# Patient Record
Sex: Male | Born: 1970 | Race: White | Hispanic: No | Marital: Married | Smoking: Current every day smoker
Health system: Southern US, Community
[De-identification: ages and names within clinical notes are randomized; demographics above are authoritative.]

## PROBLEM LIST (undated history)

## (undated) DIAGNOSIS — R011 Cardiac murmur, unspecified: Secondary | ICD-10-CM

---

## 1998-10-26 ENCOUNTER — Emergency Department (HOSPITAL_COMMUNITY): Admission: EM | Admit: 1998-10-26 | Discharge: 1998-10-26 | Payer: Self-pay | Admitting: Emergency Medicine

## 1998-10-26 ENCOUNTER — Encounter: Payer: Self-pay | Admitting: Emergency Medicine

## 1999-03-14 ENCOUNTER — Emergency Department (HOSPITAL_COMMUNITY): Admission: EM | Admit: 1999-03-14 | Discharge: 1999-03-14 | Payer: Self-pay | Admitting: Emergency Medicine

## 1999-03-15 ENCOUNTER — Encounter: Payer: Self-pay | Admitting: Emergency Medicine

## 1999-03-15 ENCOUNTER — Emergency Department (HOSPITAL_COMMUNITY): Admission: EM | Admit: 1999-03-15 | Discharge: 1999-03-15 | Payer: Self-pay | Admitting: *Deleted

## 2002-08-27 ENCOUNTER — Emergency Department (HOSPITAL_COMMUNITY): Admission: EM | Admit: 2002-08-27 | Discharge: 2002-08-27 | Payer: Self-pay | Admitting: Emergency Medicine

## 2004-01-28 ENCOUNTER — Emergency Department (HOSPITAL_COMMUNITY): Admission: EM | Admit: 2004-01-28 | Discharge: 2004-01-28 | Payer: Self-pay | Admitting: Emergency Medicine

## 2004-04-23 ENCOUNTER — Emergency Department (HOSPITAL_COMMUNITY): Admission: EM | Admit: 2004-04-23 | Discharge: 2004-04-23 | Payer: Self-pay | Admitting: Family Medicine

## 2004-07-26 ENCOUNTER — Emergency Department (HOSPITAL_COMMUNITY): Admission: EM | Admit: 2004-07-26 | Discharge: 2004-07-26 | Payer: Self-pay | Admitting: Family Medicine

## 2006-04-01 ENCOUNTER — Emergency Department (HOSPITAL_COMMUNITY): Admission: EM | Admit: 2006-04-01 | Discharge: 2006-04-01 | Payer: Self-pay | Admitting: Emergency Medicine

## 2006-04-02 ENCOUNTER — Ambulatory Visit (HOSPITAL_COMMUNITY): Admission: RE | Admit: 2006-04-02 | Discharge: 2006-04-02 | Payer: Self-pay | Admitting: *Deleted

## 2006-04-11 ENCOUNTER — Ambulatory Visit: Payer: Self-pay | Admitting: Gastroenterology

## 2006-04-17 ENCOUNTER — Ambulatory Visit: Payer: Self-pay | Admitting: Gastroenterology

## 2006-04-17 ENCOUNTER — Encounter (INDEPENDENT_AMBULATORY_CARE_PROVIDER_SITE_OTHER): Payer: Self-pay | Admitting: Specialist

## 2006-04-30 ENCOUNTER — Ambulatory Visit: Payer: Self-pay | Admitting: Cardiology

## 2006-04-30 ENCOUNTER — Ambulatory Visit: Payer: Self-pay | Admitting: Gastroenterology

## 2006-04-30 LAB — CONVERTED CEMR LAB
ALT: 19 units/L (ref 0–40)
AST: 19 units/L (ref 0–37)
Albumin: 4 g/dL (ref 3.5–5.2)
Alkaline Phosphatase: 64 units/L (ref 39–117)
BUN: 13 mg/dL (ref 6–23)
Basophils Absolute: 0.1 10*3/uL (ref 0.0–0.1)
Basophils Relative: 0.8 % (ref 0.0–1.0)
Bilirubin, Direct: 0.1 mg/dL (ref 0.0–0.3)
CO2: 27 meq/L (ref 19–32)
Calcium: 9.5 mg/dL (ref 8.4–10.5)
Chloride: 107 meq/L (ref 96–112)
Creatinine, Ser: 1 mg/dL (ref 0.4–1.5)
Eosinophils Absolute: 0.1 10*3/uL (ref 0.0–0.6)
Eosinophils Relative: 1.2 % (ref 0.0–5.0)
Fecal Occult Blood: NEGATIVE
GFR calc Af Amer: 111 mL/min
GFR calc non Af Amer: 91 mL/min
Glucose, Bld: 92 mg/dL (ref 70–99)
HCT: 46 % (ref 39.0–52.0)
Hemoglobin: 15.9 g/dL (ref 13.0–17.0)
Lymphocytes Relative: 28.8 % (ref 12.0–46.0)
MCHC: 34.5 g/dL (ref 30.0–36.0)
MCV: 93.7 fL (ref 78.0–100.0)
Monocytes Absolute: 0.7 10*3/uL (ref 0.2–0.7)
Monocytes Relative: 8.8 % (ref 3.0–11.0)
Neutro Abs: 4.7 10*3/uL (ref 1.4–7.7)
Neutrophils Relative %: 60.4 % (ref 43.0–77.0)
OCCULT 1: NEGATIVE
OCCULT 2: NEGATIVE
OCCULT 3: NEGATIVE
OCCULT 4: NEGATIVE
OCCULT 5: NEGATIVE
Platelets: 167 10*3/uL (ref 150–400)
Potassium: 4.2 meq/L (ref 3.5–5.1)
RBC: 4.91 M/uL (ref 4.22–5.81)
RDW: 11.9 % (ref 11.5–14.6)
Sodium: 141 meq/L (ref 135–145)
TSH: 0.46 microintl units/mL (ref 0.35–5.50)
Total Bilirubin: 0.8 mg/dL (ref 0.3–1.2)
Total Protein: 6.7 g/dL (ref 6.0–8.3)
WBC: 7.8 10*3/uL (ref 4.5–10.5)

## 2006-05-01 ENCOUNTER — Ambulatory Visit: Payer: Self-pay | Admitting: Internal Medicine

## 2006-05-01 LAB — CONVERTED CEMR LAB
Cholesterol: 196 mg/dL (ref 0–200)
Direct LDL: 137.1 mg/dL
HDL: 31.6 mg/dL — ABNORMAL LOW (ref >39.0)
Total CHOL/HDL Ratio: 6.2
Triglycerides: 214 mg/dL (ref 0–149)
VLDL: 43 mg/dL — ABNORMAL HIGH (ref 0–40)

## 2006-05-15 ENCOUNTER — Ambulatory Visit: Payer: Self-pay | Admitting: Cardiology

## 2006-05-27 ENCOUNTER — Ambulatory Visit (HOSPITAL_BASED_OUTPATIENT_CLINIC_OR_DEPARTMENT_OTHER): Admission: RE | Admit: 2006-05-27 | Discharge: 2006-05-27 | Payer: Self-pay | Admitting: Cardiology

## 2006-05-30 ENCOUNTER — Encounter: Payer: Self-pay | Admitting: Cardiology

## 2006-05-30 ENCOUNTER — Ambulatory Visit: Payer: Self-pay

## 2006-05-31 ENCOUNTER — Ambulatory Visit: Payer: Self-pay | Admitting: Pulmonary Disease

## 2006-06-06 ENCOUNTER — Ambulatory Visit: Payer: Self-pay | Admitting: Cardiology

## 2006-06-14 ENCOUNTER — Ambulatory Visit: Payer: Self-pay

## 2010-07-14 NOTE — Assessment & Plan Note (Signed)
[facility]                         GASTROENTEROLOGY OFFICE NOTE   NAME:[name], [name]                     MRN:          [id number]  DATE:04/11/2006                            DOB:          21-Apr-1970    CHIEF COMPLAINT:  The patient is a 40 year old white male, self-referred  for evaluation of GERD, abdominal pain and alternating diarrhea and  constipation.   HISTORY OF PRESENT ILLNESS:  This patient presented to the emergency  room on February 4 for evaluation of abdominal pain.  He has had  multiple episodes of abdominal pain in the epigastric region and in the  right upper quadrant.  He also has lower abdominal discomfort,  associated with bloating and alternating diarrhea and constipation.  He  relates problems with GERD.  He was initially treated with Prevacid,  which was very effective for his symptoms, but was changed to Protonix,  due to improved insurance coverage.  He has lost about 15 pounds over  the past month and has had a substantial decrease in appetite.  CBC,  CMET, lipase and urinalysis from April 01, 2006, were all  unremarkable.  Abdominal ultrasound from April 02, 2006, was normal.   PAST MEDICAL HISTORY:  As in the History of Present Illness.   PAST SURGICAL HISTORY:  Negative.   CURRENT MEDICATIONS:  1. Carafate p.r.n.  2. Vicodin p.r.n.  3. Prevacid daily.   SOCIAL HISTORY:  He is separated and smokes about a pack a day of  cigarettes.  He drinks alcohol on a social basis and denies heavier  usage.  He has one child.   REVIEW OF SYSTEMS:  Remarkable for excessive thirst, back pain, new  headaches and severe fatigue.  The remainder of the review of systems is  negative.   PHYSICAL EXAM:  Mildly anxious.  Height 5 feet 11 inches, weight 239  pounds.  Blood pressure is 110/68, pulse 80 and regular.  HEENT EXAM:  Anicteric sclerae.  Oropharynx clear.  CHEST:  Clear to auscultation bilaterally.  CARDIAC:   Regular rate and rhythm without murmurs appreciated.  ABDOMEN:  Soft with mild epigastric tenderness to deep palpation, no  rebound or guarding.  No palpable organomegaly, masses or hernias.  Normoactive bowel sounds.  RECTAL:  Deferred to time of colonoscopy.  EXTREMITIES:  Without clubbing, cyanosis or edema.  NEUROLOGIC:  Alert and oriented times three.  Appears grossly nonfocal.   ASSESSMENT AND PLAN:  Epigastric pain, right upper quadrant pain, reflux  symptoms, alternating diarrhea and constipation and an unexplained 15-  pound weight-loss.  Obtain stool Hemoccults.  Rule out GERD, ulcer  disease, inflammatory bowel disease and, less likely, colorectal  neoplasms.  Begin Robinul Forte 1 b.i.d.  Begin standard antireflux  measures.  Begin Protonix 40 mg p.o. q.a.m.  Risks, benefits, and  alternatives to colonoscopy and possible biopsy and possible polypectomy  and upper endoscopy and possible biopsy discussed with the patient.  He  consents to proceed.  This will be scheduled electively.     Venita Lick. Russella Dar, MD, [facility]  Electronically Signed    MTS/MedQ  DD: 04/12/2006  DT: 04/12/2006  Job #: [id number]

## 2010-07-14 NOTE — Assessment & Plan Note (Signed)
[facility]                            CARDIOLOGY OFFICE NOTE   NAME:[name]                     MRN:          [id number]  DATE:04/30/2006                            DOB:          Mar 30, 1970    PRIMARY CARE PHYSICIAN:  Dr. Jonny Ruiz.   REASON FOR PRESENTATION:  Evaluate patient with premature ventricular  contractions.   HISTORY OF PRESENT ILLNESS:  The patient is a pleasant 40 year old  gentleman without prior cardiac history.  He was recently being sedated  for upper and lower endoscopy when he was noted to have ventricular  bigeminy.  He does not report having noticed this in particular at that  time.  He denies any presyncope or syncope.  However, he does report  that he gets episodes of his heart beating hard and fast.  This happens  3-4 times per day.  It can happen at rest.  It also can happen with  heavy lifting.  He does get a little bit dizzy with this.  He may get  mildly short of breath.  He denies any chest pressure, neck discomfort,  arm discomfort, activity induced nausea, vomiting, excessive  diaphoresis.  He has frequent headaches.  He does report that he snores  quite a bit.  He is not sure if he stops breathing.  He is active at  work but does not exercise routinely.   PAST MEDICAL HISTORY:  1. Irritable bowel syndrome.  2. Colonic polyps.   ALLERGIES:  NONE.   MEDICATIONS:  1. Protonix 40 mg daily.  2. Prevacid p.r.n.   SOCIAL HISTORY:  The patient is a pipe fitter.  He is single.  He has  two 73 year old daughters.  He has been smoking at least a half pack per  day for 25 years.  Still drinks beer rarely.  He does not use any other  drugs.   FAMILY HISTORY:  Contributory for his mother having bypass in her 16s.  He has a brother in his 30s with heart disease, but he is not sure what  type.   REVIEW OF SYSTEMS:  As stated in the HPI and negative for other systems.   PHYSICAL EXAMINATION:  The patient is in no  distress.  Blood pressure  137/79, heart rate 65 and regular, weight 237 pounds, body mass index  32.  HEENT:  Eyelids unremarkable, pupils equally round, and reactive to  light, fundi not visualized, oral mucosa unremarkable.  NECK:  No jugular venous distention, wave form within normal limits,  carotid upstroke brisk and symmetrical, no bruits, no thyromegaly.  LYMPHATICS:  No cervical, axillary, inguinal adenopathy.  LUNGS:  Clear to auscultation bilaterally.  BACK:  No costovertebral angle tenderness.  CHEST:  Unremarkable.  HEART:  PMI not displaced or sustained, S1 and S2 within normal limits,  no S3, no S4, no clicks, no rubs, no murmurs.  ABDOMEN:  Obese, positive bowel sounds normal in frequency and pitch, no  bruits, no rebound, no guarding, no midline pulsatile mass, no  hepatomegaly, splenomegaly.  SKIN:  No rashes, no nodules.  EXTREMITIES:  With 2+ pulses  throughout, no edema, no cyanosis, no  clubbing.  NEURO:  Oriented to person, place and time, cranial nerves II-XII  grossly intact, motor grossly intact.   EKG sinus rhythm, rate 65, axis within normal limits, intervals within  normal limits, no acute ST-T wave changes.   ASSESSMENT/PLAN:  1. Palpitations.  This patient has palpitations with documented      premature ventricular contractions.  I am going to put a 24 hour      Holter Monitor on because he also describes a tachy arrhythmia      rather than an irregular beat.  We will see if this correlates with      any dysrhythmia on his monitor.  We are also going to check      electrolytes and a TSH.  Further evaluation will be based on these      results.  2. Tobacco.  We had a long discussion about the need to stop smoking.      He is not yet committed to quitting but thinks he can eventually.  3. Followup.  I would like to see him back in about one month or      sooner.     Rollene Rotunda, MD, [facility]  Electronically Signed    JH/MedQ  DD: 04/30/2006  DT:  04/30/2006  Job #: [id number]

## 2010-07-14 NOTE — Procedures (Signed)
[name], [name]              ACCOUNT NO.:  [id number]   MEDICAL RECORD NO.:  [id number]          PATIENT TYPE:  OUT   LOCATION:  SLEEP CENTER                 FACILITY:  [facility]   PHYSICIAN:  Barbaraann Share, MD,FCCPDATE OF BIRTH:  02/23/73   DATE OF STUDY:  05/30/2006                            NOCTURNAL POLYSOMNOGRAM   REFERRING PHYSICIAN:   INDICATIONS FOR PROCEDURE:  Hypersomnia with sleep apnea.   RESULTS:  Epward score is 16.   SLEEP ARCHITECTURE:  The patient had total sleep time of 353 minutes  with decreased REM and never achieved slow wave sleep. Sleep onset  latency was normal, as was REM onset. Sleep efficiency was 90%.   RESPIRATORY DATA:  The patient underwent split night protocol where he  was found to have 31 obstructive events in the first 163 minutes of  sleep. This gave him a extrapolated apnea hypopnea index of 11 events  per hour during the first half of the night. The events occurred  primarily in the supine position and there was mild to moderate snoring  noted. By protocol, the patient was then placed on a medium ResMed  Quattro mask and ultimately titrated to a final CPAP pressure of 10 cm  of water, in order to eliminate both obstructive events and snoring.   OXYGEN DATA:  There was O2 desaturation as low as 85% with the patient's  obstructive events.   CARDIAC DATA:  Frequent PVC's were noted throughout as well as bigeminy.   MOVEMENT/PARASOMNIA:  The patient was found to have 79 leg jerks during  the night with 3.7 per hour resulting in arousal or awakening. However,  these greatly decreased as the CPAP pressure was optimized.   IMPRESSION/RECOMMENDATION:  1. Split night study reveals mild obstructive sleep apnea/hypopnea      syndrome with an apnea hypopnea of 11 events per hour during the      first half of the night and O2 desaturation as low as 85%. The      patient was then placed on a medium ResMed Quattro full face mask      and  ultimately titrated to a final pressure of 10 cm of water, in      order to control both obstructive events and snoring. Because of      the very mild nature of the patient's obstructive apnea, reasonable      treatment could also include weight loss alone if applicable, upper      airway surgery, or oral appliance. Treatment decisions should      really be based on the patient's lifestyle.  2. Moderate numbers of leg jerks with significant sleep disruption.      These were almost eliminated by optimal      CPAP pressure, and therefore completely related to the patient's      sleep disordered breathing.  3. Frequent PVC's were noted throughout as well as bigeminy.      Barbaraann Share, MD,FCCP  Diplomate, American Board of Sleep  Medicine  Electronically Signed     KMC/MEDQ  D:  05/30/2006 17:25:36  T:  05/30/2006 21:18:41  Job:  [id number]

## 2010-07-14 NOTE — Assessment & Plan Note (Signed)
[facility]                            CARDIOLOGY OFFICE NOTE   NAME:[name]                       MRN:          [id number]  DATE:06/06/2006                            DOB:          15-Apr-1970    PRIMARY CARE PHYSICIAN:  Dr. Oliver Barre.   REASON FOR PRESENTATION:  Evaluate patient with frequent ventricular  ectopy.   HISTORY OF PRESENT ILLNESS:  Patient returns for followup. He is 40  years old. He has ectopy with symptoms as described in the April 30, 2006  note. I did put a Holter monitor on that demonstrated frequent premature  ventricular contractions. He had ventricular bigeminy. We ordered an  echocardiogram which demonstrated an ejection fraction of 50% to 55%  with no regional wall motion abnormalities or valve abnormalities. He  still feels the palpitations. He feels them more when he is active than  at rest. He denies any pre syncope or syncope. He does get somewhat  lightheaded and thinks that his blood pressure drops when he is having  the palpitations. He is not having any chest pressure, neck discomfort,  or arm discomfort. He has been complaining of some difficultly with  erections that he relates to the palpitations.   PAST MEDICAL HISTORY:  Irritable bowel syndrome, colonic polyps.   ALLERGIES:  None.   MEDICATIONS:  Protonix 40 mg daily.   REVIEW OF SYSTEMS:  As stated in the HPI and otherwise negative for  other systems.   PHYSICAL EXAMINATION:  Patient is in no distress. Blood pressure 139/84,  heart rate 91 and regular, weight 237 pounds, body mass index 32.  HEENT: Eyelids unremarkable. Pupils equal, round, and reactive to light,  fundi not visualized.  NECK: No jugular venous distension, wave form within normal limits.  Carotid upstrokes brisk and symmetric. No bruits or thyromegaly.  LYMPHATICS: No lymphadenopathy.  LUNGS: Clear to auscultation bilaterally.  BACK: No costovertebral angle tenderness.  CHEST:  Unremarkable.  HEART: PMI not displaced or sustained, S1 and S2 within normal limits.  No S3. No S4, clicks, rubs, murmurs.  ABDOMEN: Obese, positive bowel sounds normal to frequency and pitch. No  bruits, rebound, guarding. No midline pulsatile mass. No organomegaly.  SKIN: No rashes. No nodules.  EXTREMITIES: 2 + pulses throughout. No edema. No cyanosis or clubbing.  NEURO: Oriented to person, place, and time. Cranial nerves II-XII  grossly intact. Motor grossly intact.   ASSESSMENT/PLAN:  1. Premature ventricular contractions, this patient is having frequent      ectopy. At this point I planed a stress perfusion study. If the      stress perfusion study is normal along with his unremarkable      echocardiogram, we would consider only symptomatic treatment. I      have going to give him prescription for metoprolol extended release      50 mg.  2. Tobacco, we did have a discussion about the need to stop smoking      and he will consider trying this. He was given a prescription for      Chantix in the past.  3. We will see him back based on the results of the above.     Rollene Rotunda, MD, [facility]  Electronically Signed    JH/MedQ  DD: 06/06/2006  DT: 06/06/2006  Job #: [id number]   cc:   Corwin Levins, MD

## 2014-09-27 ENCOUNTER — Emergency Department (INDEPENDENT_AMBULATORY_CARE_PROVIDER_SITE_OTHER)
Admission: EM | Admit: 2014-09-27 | Discharge: 2014-09-27 | Disposition: A | Discharge status: Home / Self Care | Payer: 59 | Source: Home / Self Care

## 2014-09-27 ENCOUNTER — Encounter (HOSPITAL_COMMUNITY): Payer: Self-pay | Admitting: Emergency Medicine

## 2014-09-27 DIAGNOSIS — G4733 Obstructive sleep apnea (adult) (pediatric): Secondary | ICD-10-CM

## 2014-09-27 DIAGNOSIS — H10231 Serous conjunctivitis, except viral, right eye: Secondary | ICD-10-CM | POA: Diagnosis not present

## 2014-09-27 DIAGNOSIS — Z9119 Patient's noncompliance with other medical treatment and regimen: Secondary | ICD-10-CM

## 2014-09-27 DIAGNOSIS — I493 Ventricular premature depolarization: Secondary | ICD-10-CM

## 2014-09-27 DIAGNOSIS — F17200 Nicotine dependence, unspecified, uncomplicated: Secondary | ICD-10-CM

## 2014-09-27 DIAGNOSIS — Z91199 Patient's noncompliance with other medical treatment and regimen due to unspecified reason: Secondary | ICD-10-CM

## 2014-09-27 HISTORY — DX: Cardiac murmur, unspecified: R01.1

## 2014-09-27 MED ORDER — TETRACAINE HCL 0.5 % OP SOLN
OPHTHALMIC | Status: AC
  Filled 2014-09-27: qty 2

## 2014-09-27 MED ORDER — ERYTHROMYCIN 5 MG/GM OP OINT: 1.0000 "application " | TOPICAL_OINTMENT | Freq: Every day | OPHTHALMIC | Status: AC

## 2014-09-27 NOTE — ED Provider Notes (Signed)
CSN: [id number]     Arrival date & time 09/27/14  1304 History   First MD Initiated Contact with Patient 09/27/14 1451     Chief Complaint  Patient presents with  . Eye Problem   HPI  44 Y/O ? Prior history of obstructive sleep apnea Epworth 16, IBS, PVCs, colonic polyps presents to urgent care center for eye pain. 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 He tells me that he has been compliant with this and has also had no antecedent sore throat or ear pain. He does not have any small children that's on them that have been sick He tells me that since 7/30 he has had pain in his eye without diplopia He then developed irritation and redness of the eye and because of this came to the /urgency care center  He smokes a pack a day and is trying to quit that recent marital discord cost him to pick back up His PVCs have not been bothering him He is not taking any blood pressure medication and he is noncompliant on his sleep apnea machine/BiPAP machine  Past Medical History  Diagnosis Date  . Heart murmur    History reviewed. No pertinent past surgical history. History reviewed. No pertinent family history. History  Substance Use Topics  . Smoking status: Current Every Day Smoker -- 1.00 packs/day    Types: Cigarettes  . Smokeless tobacco: Not on file  . Alcohol Use: Yes     Comment: occasional    Review of Systems  No chest pain, no vomiting, no blurred vision, no double vision, no unilateral weakness, no twisting of the mouth no chest pain or shortness of breath no nausea no vomiting A 14 point ROS was performed and is negative except as noted in the HPI   Allergies  Review of patient's allergies indicates no known allergies.  Home Medications   Prior to Admission medications   Not on File   BP 122/75 mmHg  Pulse 77  Temp(Src) 98.8 F (37.1 C) (Oral)  Resp 16  SpO2 97% Physical Exam Crusty right eye with intense scleral  injection upper and lower sclerae are.  Funduscopy shows an intact And disc however on left side I'm not able to appreciate this there is normal vasculature from my limited view Chest is clear Very poor dentition No lower extremity edema no abdominal discomfort   ED Course  Procedures (including critical care time) Labs Review Labs Reviewed - No data to display  Imaging Review No results found.   MDM   1. Serous non-viral conjunctivitis of right eye   2. Smoker   3. OSA (obstructive sleep apnea)   4. PVC (premature ventricular contraction)   5. Noncompliance    Patient has what sounds suspicious for a viral conjunctivitis which has become secondary bacterial infected His right eye is crusty and has some element of an exudate and he shows me a picture on his phone of his eye I do not think that this is life-threatening and this has not affected his vision and he has no diplopia however he has significant scleral injection and therefore I will treat him with erythromycin ointment and he will need a referral to primary care at some point  He is noncompliant on his sleep apnea/BiPAP treatment and he is not taking his blood pressure medication for his for his PVCs so he will need to reestablish care at some point I did  mention to him that he needs to try to quit smoking as well  Pleas Koch, MD [facility] [phone]     Rhetta Mura, MD 09/27/14 [phone]

## 2014-09-27 NOTE — ED Notes (Signed)
Pt started with pain in his right eye on Friday.  The pain subsided, but he woke up this morning with d/c, burning and itching.

## 2014-09-27 NOTE — Discharge Instructions (Signed)
Bacterial Conjunctivitis °Bacterial conjunctivitis (commonly called pink eye) is redness, soreness, or puffiness (inflammation) of the white part of your eye. It is caused by a germ called bacteria. These germs can easily spread from person to person (contagious). Your eye often will become red or pink. Your eye may also become irritated, watery, or have a thick discharge.  °HOME CARE  °· Apply a cool, clean washcloth over closed eyelids. Do this for 10-20 minutes, 3-4 times a day while you have pain. °· Gently wipe away any fluid coming from the eye with a warm, wet washcloth or cotton ball. °· Wash your hands often with soap and water. Use paper towels to dry your hands. °· Do not share towels or washcloths. °· Change or wash your pillowcase every day. °· Do not use eye makeup until the infection is gone. °· Do not use machines or drive if your vision is blurry. °· Stop using contact lenses. Do not use them again until your doctor says it is okay. °· Do not touch the tip of the eye drop bottle or medicine tube with your fingers when you put medicine on the eye. °GET HELP RIGHT AWAY IF:  °· Your eye is not better after 3 days of starting your medicine. °· You have a yellowish fluid coming out of the eye. °· You have more pain in the eye. °· Your eye redness is spreading. °· Your vision becomes blurry. °· You have a fever or lasting symptoms for more than 2-3 days. °· You have a fever and your symptoms suddenly get worse. °· You have pain in the face. °· Your face gets red or puffy (swollen). °MAKE SURE YOU:  °· Understand these instructions. °· Will watch this condition. °· Will get help right away if you are not doing well or get worse. °Document Released: 11/22/2007 Document Revised: 01/30/2012 Document Reviewed: 10/19/2011 °ExitCare® Patient Information ©2015 [employer]. This information is not intended to replace advice given to you by your health care provider. Make sure you discuss any questions you have  with your health care provider. ° °

## 2015-09-26 ENCOUNTER — Other Ambulatory Visit: Payer: Self-pay | Admitting: Orthopedic Surgery

## 2015-09-26 DIAGNOSIS — M5431 Sciatica, right side: Secondary | ICD-10-CM

## 2015-10-12 ENCOUNTER — Other Ambulatory Visit: Payer: Self-pay | Admitting: Orthopedic Surgery

## 2015-10-12 DIAGNOSIS — M5431 Sciatica, right side: Secondary | ICD-10-CM

## 2015-10-19 ENCOUNTER — Ambulatory Visit
Admission: RE | Admit: 2015-10-19 | Discharge: 2015-10-19 | Disposition: A | Discharge status: Home / Self Care | Payer: 59 | Source: Ambulatory Visit | Attending: Orthopedic Surgery | Admitting: Orthopedic Surgery

## 2015-10-19 DIAGNOSIS — M5431 Sciatica, right side: Secondary | ICD-10-CM

## 2018-01-11 IMAGING — MR MR LUMBAR SPINE W/O CM
4 of 5 series · 19 of 48 positions shown · non-contrast
Comparison: None.

CLINICAL DATA: Low back pain and right leg numbness.

EXAM:
MRI LUMBAR SPINE WITHOUT CONTRAST
TECHNIQUE: Multiplanar, multisequence MR imaging of the lumbar spine was
performed. No intravenous contrast was administered.

[Series 7: T1 · sagittal · 4.0mm · 0.73mm/px · 3 of 15 slices shown (1 of 2)]
[im 1/15]
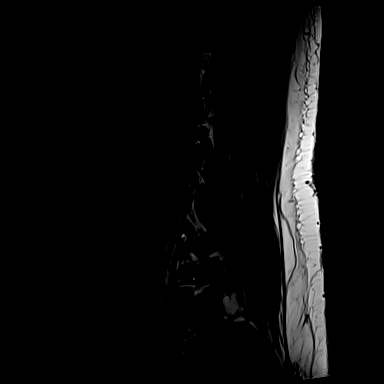
[im 8/15]
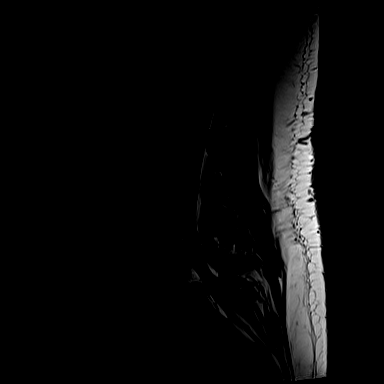
[im 15/15]
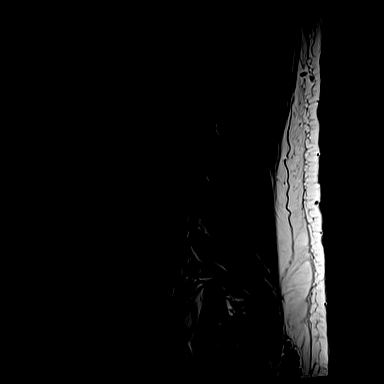

[Series 8: T2 · sagittal · 4.0mm · 0.73mm/px · 5 of 15 slices shown (1 of 2)]
[im 1/15]
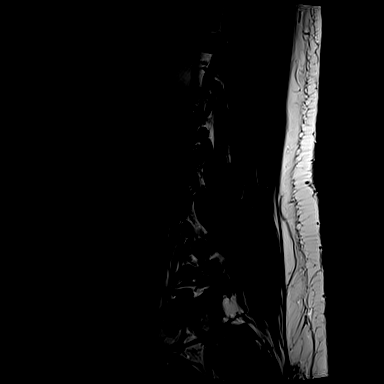
[im 4/15]
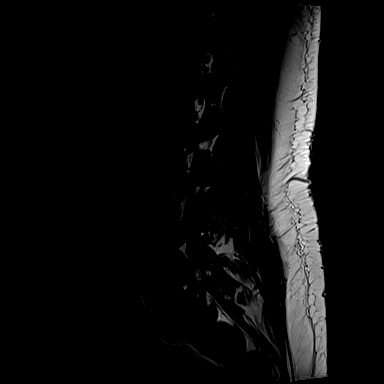
[im 8/15]
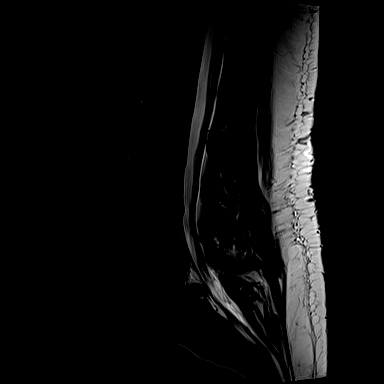
[im 11/15]
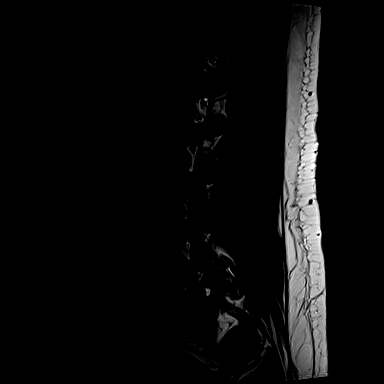
[im 15/15]
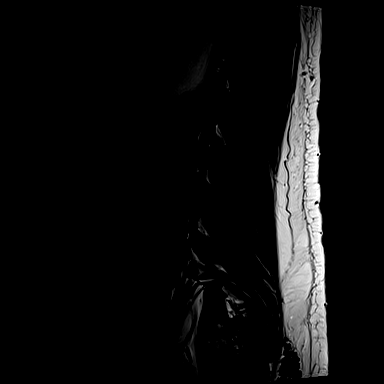

[Series 9: T1 · axial · 4.0mm · 0.56mm/px · z∈[-69,+105]mm · 3 of 44 slices shown (2 of 2)]
[im 6/44]
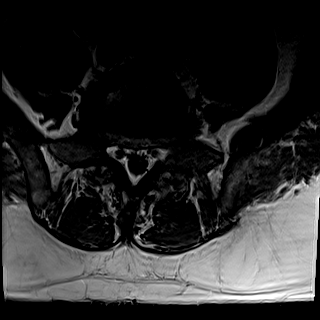
[im 23/44]
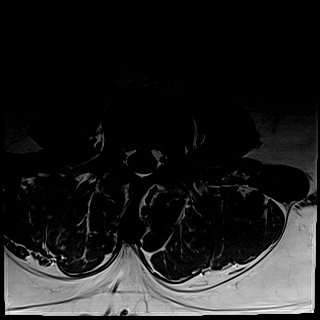
[im 38/44]
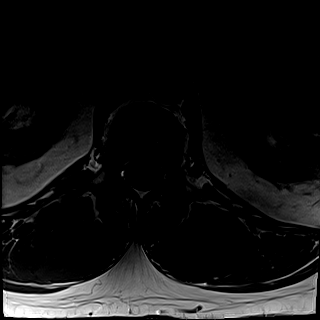

[Series 10: T2 · axial · 4.0mm · 0.28mm/px · z∈[-84,+105]mm · 8 of 44 slices shown (2 of 2)]
[im 3/44]
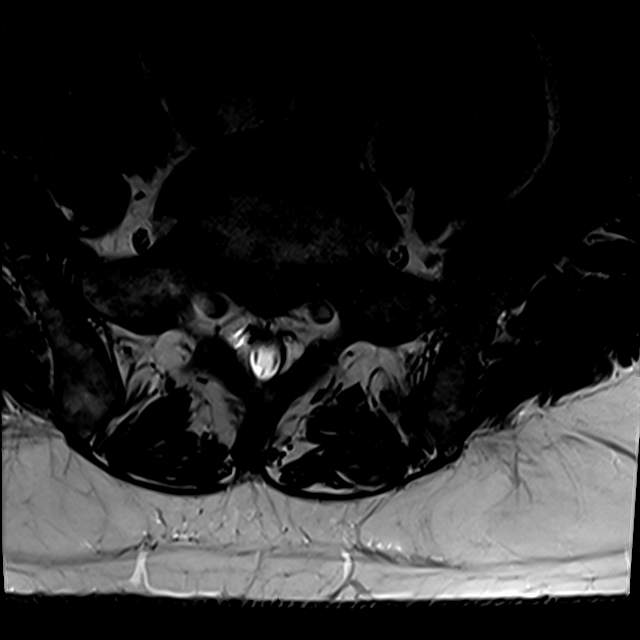
[im 6/44]
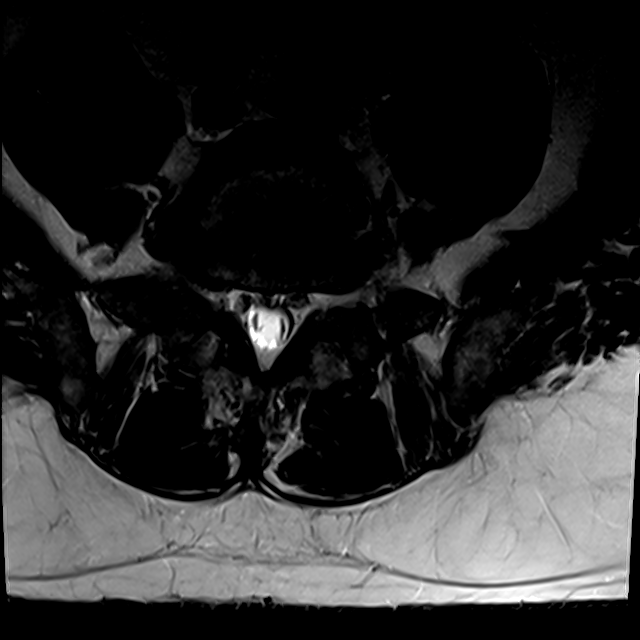
[im 9/44]
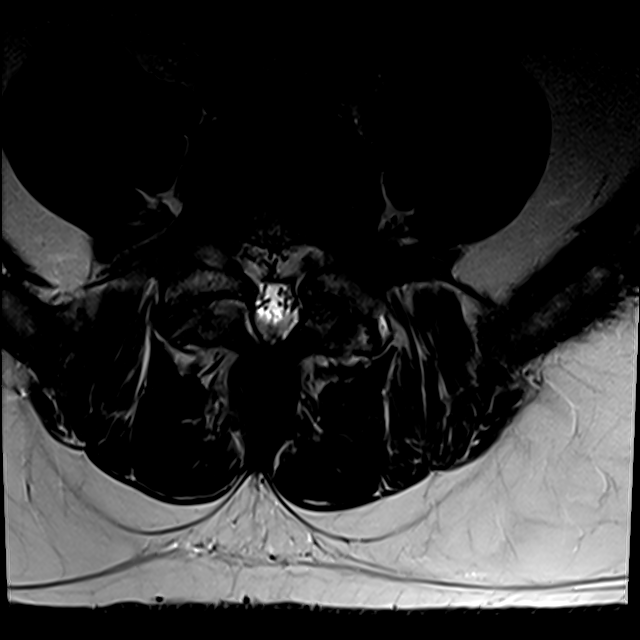
[im 15/44]
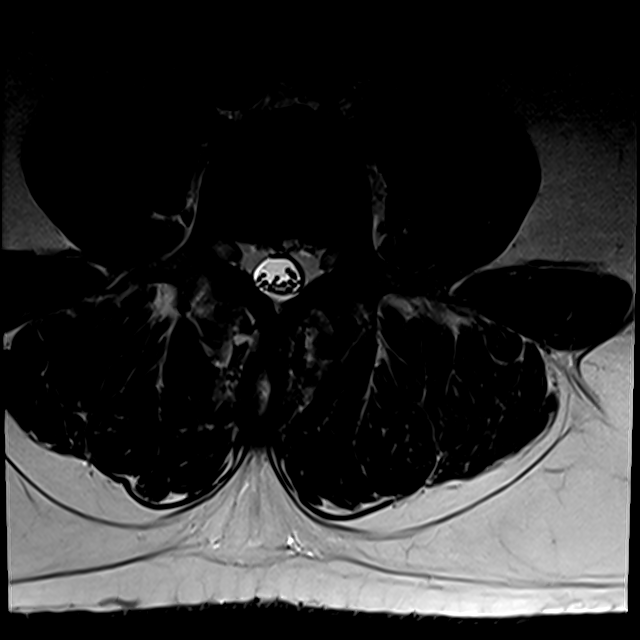
[im 21/44]
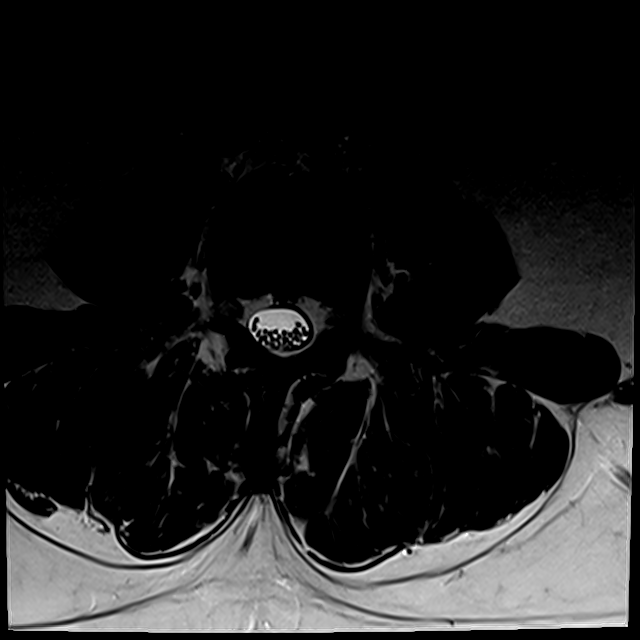
[im 23/44]
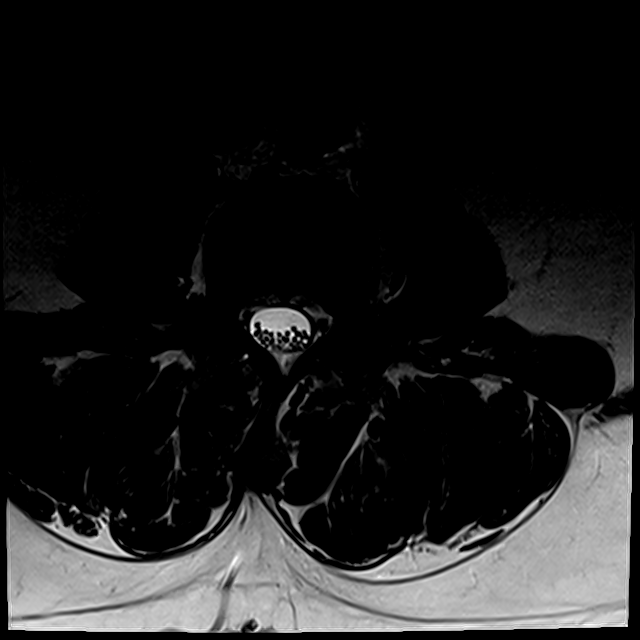
[im 26/44]
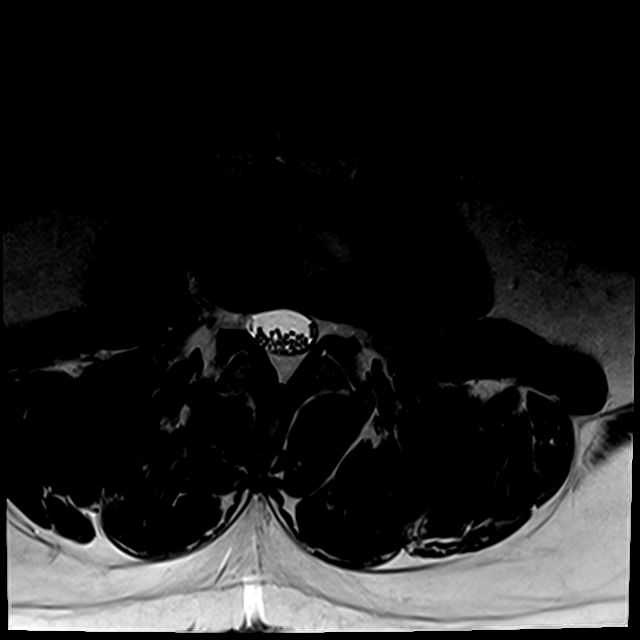
[im 38/44]
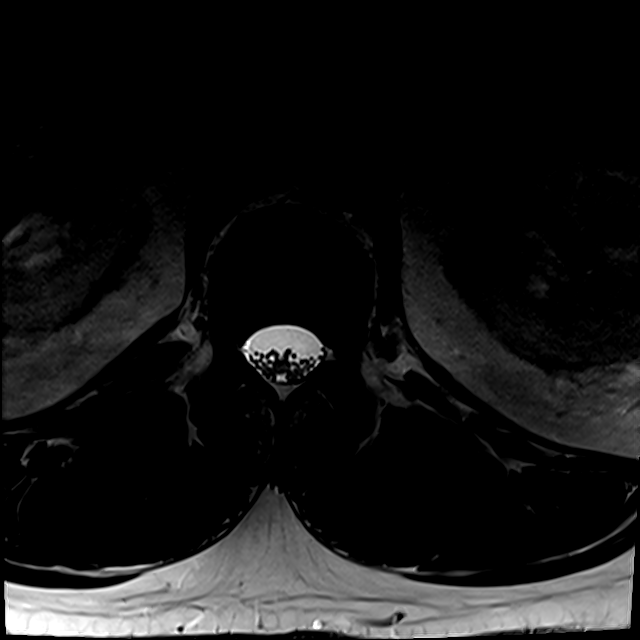

[19 of 48 positions shown; findings below may reference images not displayed]

FINDINGS: Segmentation: Normal. The lowest disc space is considered to be
L5-S1.

Alignment: Grade 1 anterolisthesis at L5-S1 with bilateral pars
interarticularis defects.

Vertebrae:  Type 2 Modic endplate changes at L5-S1.

Conus medullaris: Extends to the L1 level and appears normal.

Paraspinal and other soft tissues: The visualized aorta, IVC and
iliac vessels are normal. The visualized retroperitoneal organs and
paraspinal soft tissues are normal.

Disc levels:

T12-L1: Normal disc space and facet joints. No spinal canal
stenosis. No neuroforaminal stenosis.

L1-L2: Normal disc space and facet joints. No spinal canal stenosis.
No neuroforaminal stenosis.

L2-L3: Normal disc space and facet joints. No spinal canal stenosis.
No neuroforaminal stenosis.

L3-L4: Disc desiccation with small left extraforaminal protrusion
and annular fissure. Small amount of fluid in the right facet joint.
No spinal canal stenosis. Mild left neuroforaminal stenosis.

L4-L5: Moderate facet hypertrophy and right foraminal disc extrusion
with annular fissure. Right lateral recess stenosis and posterior
displacement of the descending right L5 nerve root. No central
spinal canal stenosis. Mild right neuroforaminal stenosis.

L5-S1: Severe disc space narrowing. Grade 1 anterolisthesis with
bilateral pars interarticularis defects. Disc pseudo bulge and
annular fissure. Moderate bilateral facet hypertrophy. No spinal
canal stenosis. Severe bilateral neuroforaminal stenosis.
IMPRESSION: 1. Grade 1 L5-S1 anterolisthesis with bilateral pars
interarticularis defects. Severe bilateral neural foraminal
narrowing.
2. Right L4-5 foraminal disc extrusion with associated right lateral
recess stenosis and displacement of the descending right L5 nerve
root. Correlate for symptoms of right L5 radiculopathy.
3. Left L3-4 extra foraminal protrusion and annular fissure with
mild left neural foraminal stenosis.

## 2022-07-31 ENCOUNTER — Encounter: Payer: Self-pay | Admitting: Gastroenterology
